# Patient Record
Sex: Female | Born: 1940 | Race: White | Hispanic: No | Smoking: Never smoker
Health system: Southern US, Community
[De-identification: ages and names within clinical notes are randomized; demographics above are authoritative.]

## PROBLEM LIST (undated history)

## (undated) HISTORY — PX: TONSILLECTOMY: SUR1361

## (undated) HISTORY — PX: OOPHORECTOMY: SHX86

## (undated) HISTORY — PX: APPENDECTOMY: SHX54

## (undated) HISTORY — PX: STAPEDES SURGERY: SHX789

---

## 1999-03-07 ENCOUNTER — Other Ambulatory Visit: Admission: RE | Admit: 1999-03-07 | Discharge: 1999-03-07 | Payer: Self-pay | Admitting: Obstetrics & Gynecology

## 1999-06-29 ENCOUNTER — Ambulatory Visit (HOSPITAL_COMMUNITY): Admission: RE | Admit: 1999-06-29 | Discharge: 1999-06-29 | Payer: Self-pay | Admitting: Gastroenterology

## 2000-03-21 ENCOUNTER — Other Ambulatory Visit: Admission: RE | Admit: 2000-03-21 | Discharge: 2000-03-21 | Payer: Self-pay | Admitting: Obstetrics & Gynecology

## 2001-05-08 ENCOUNTER — Other Ambulatory Visit: Admission: RE | Admit: 2001-05-08 | Discharge: 2001-05-08 | Payer: Self-pay | Admitting: Obstetrics & Gynecology

## 2002-05-14 ENCOUNTER — Other Ambulatory Visit: Admission: RE | Admit: 2002-05-14 | Discharge: 2002-05-14 | Payer: Self-pay | Admitting: Obstetrics & Gynecology

## 2003-06-27 ENCOUNTER — Other Ambulatory Visit: Admission: RE | Admit: 2003-06-27 | Discharge: 2003-06-27 | Payer: Self-pay | Admitting: Obstetrics & Gynecology

## 2004-07-06 ENCOUNTER — Other Ambulatory Visit: Admission: RE | Admit: 2004-07-06 | Discharge: 2004-07-06 | Payer: Self-pay | Admitting: Obstetrics & Gynecology

## 2005-08-28 ENCOUNTER — Other Ambulatory Visit: Admission: RE | Admit: 2005-08-28 | Discharge: 2005-08-28 | Payer: Self-pay | Admitting: Obstetrics & Gynecology

## 2012-03-10 ENCOUNTER — Encounter: Payer: Self-pay | Admitting: Gastroenterology

## 2012-04-03 ENCOUNTER — Ambulatory Visit (AMBULATORY_SURGERY_CENTER): Payer: Medicare Other | Admitting: *Deleted

## 2012-04-03 VITALS — Ht 60.0 in | Wt 134.0 lb

## 2012-04-03 DIAGNOSIS — Z1211 Encounter for screening for malignant neoplasm of colon: Secondary | ICD-10-CM

## 2012-04-03 MED ORDER — MOVIPREP 100 G PO SOLR: ORAL | Status: DC

## 2012-04-17 ENCOUNTER — Telehealth: Payer: Self-pay | Admitting: Gastroenterology

## 2012-04-17 ENCOUNTER — Encounter: Payer: Self-pay | Admitting: Gastroenterology

## 2012-04-17 ENCOUNTER — Ambulatory Visit (AMBULATORY_SURGERY_CENTER): Payer: Medicare Other | Admitting: Gastroenterology

## 2012-04-17 VITALS — BP 150/84 | HR 67 | Temp 98.2°F | Resp 18 | Ht 60.0 in | Wt 134.0 lb

## 2012-04-17 DIAGNOSIS — K5730 Diverticulosis of large intestine without perforation or abscess without bleeding: Secondary | ICD-10-CM

## 2012-04-17 DIAGNOSIS — Z1211 Encounter for screening for malignant neoplasm of colon: Secondary | ICD-10-CM

## 2012-04-17 MED ORDER — SODIUM CHLORIDE 0.9 % IV SOLN: 500.0000 mL | INTRAVENOUS | Status: DC

## 2012-04-17 NOTE — Op Note (Signed)
[facility] [address] [place], [place]  [address]  COLONOSCOPY PROCEDURE REPORT  PATIENT:  Karina Adams, Karina Adams  MR#:  [id number] BIRTHDATE:  1941-07-19, 71 yrs. old  GENDER:  female ENDOSCOPIST:  Barbette Hair. Arlyce Dice, MD REF. BY:  Mila Palmer, M.D. PROCEDURE DATE:  04/17/2012 PROCEDURE:  Diagnostic Colonoscopy ASA CLASS:  Class II INDICATIONS:  Routine Risk Screening MEDICATIONS:   MAC sedation, administered by CRNA propofol 250mg IV  DESCRIPTION OF PROCEDURE:   After the risks benefits and alternatives of the procedure were thoroughly explained, informed consent was obtained.  Digital rectal exam was performed and revealed external hemorrhoids.   The LB [id number] endoscope was introduced through the anus and advanced to the cecum, which was identified by both the appendix and ileocecal valve, without limitations.  The quality of the prep was excellent, using MoviPrep.  The instrument was then slowly withdrawn as the colon was fully examined. <<PROCEDUREIMAGES>>  FINDINGS:  Scattered diverticula were found (see image1). sigmoid to transverse colon  Internal Hemorrhoids were found (see image3). This was otherwise a normal examination of the colon (see image2). Retroflexed views in the rectum revealed no abnormalities.    The time to cecum =  1) 9.75  minutes. The scope was then withdrawn in 1) 6.0  minutes from the cecum and the procedure completed. COMPLICATIONS:  None ENDOSCOPIC IMPRESSION: 1) Diverticula, scattered 2) Internal hemorrhoids 3) Otherwise normal examination RECOMMENDATIONS: 1) Continue current colorectal screening recommendations for "routine risk" patients with a repeat colonoscopy in 10 years. REPEAT EXAM:  In 10 year(s) for Colonoscopy.  ______________________________ Barbette Hair. Arlyce Dice, MD  CC:  n. eSIGNED:   Barbette Hair. Zebastian Carico at 04/17/2012 08:43 AM  Jeannett Senior, [id number]

## 2012-04-17 NOTE — Patient Instructions (Addendum)
YOU HAD AN ENDOSCOPIC PROCEDURE TODAY AT [facility]: Refer to the procedure report that was given to you for any specific questions about what was found during the examination.  If the procedure report does not answer your questions, please call your gastroenterologist to clarify.  If you requested that your care partner not be given the details of your procedure findings, then the procedure report has been included in a sealed envelope for you to review at your convenience later.  YOU SHOULD EXPECT: Some feelings of bloating in the abdomen. Passage of more gas than usual.  Walking can help get rid of the air that was put into your GI tract during the procedure and reduce the bloating. If you had a lower endoscopy (such as a colonoscopy or flexible sigmoidoscopy) you may notice spotting of blood in your stool or on the toilet paper. If you underwent a bowel prep for your procedure, then you may not have a normal bowel movement for a few days.  DIET: Your first meal following the procedure should be a light meal and then it is ok to progress to your normal diet.  A half-sandwich or bowl of soup is an example of a good first meal.  Heavy or fried foods are harder to digest and may make you feel nauseous or bloated.  Likewise meals heavy in dairy and vegetables can cause extra gas to form and this can also increase the bloating.  Drink plenty of fluids but you should avoid alcoholic beverages for 24 hours.  ACTIVITY: Your care partner should take you home directly after the procedure.  You should plan to take it easy, moving slowly for the rest of the day.  You can resume normal activity the day after the procedure however you should NOT DRIVE or use heavy machinery for 24 hours (because of the sedation medicines used during the test).    SYMPTOMS TO REPORT IMMEDIATELY: A gastroenterologist can be reached at any hour.  During normal business hours, 8:30 AM to 5:00 PM Monday through Friday,  call [phone].  After hours and on weekends, please call the GI answering service at [phone] who will take a message and have the physician on call contact you.   Following lower endoscopy (colonoscopy or flexible sigmoidoscopy):  Excessive amounts of blood in the stool  Significant tenderness or worsening of abdominal pains  Swelling of the abdomen that is new, acute  Fever of 100F or higher     FOLLOW UP: If any biopsies were taken you will be contacted by phone or by letter within the next 1-3 weeks.  Call your gastroenterologist if you have not heard about the biopsies in 3 weeks.  Our staff will call the home number listed on your records the next business day following your procedure to check on you and address any questions or concerns that you may have at that time regarding the information given to you following your procedure. This is a courtesy call and so if there is no answer at the home number and we have not heard from you through the emergency physician on call, we will assume that you have returned to your regular daily activities without incident.  SIGNATURES/CONFIDENTIALITY: You and/or your care partner have signed paperwork which will be entered into your electronic medical record.  These signatures attest to the fact that that the information above on your After Visit Summary has been reviewed and is understood.  Full responsibility of the  confidentiality of this discharge information lies with you and/or your care-partner.   Diverticulosis, hemorrhoid information, high fiber diet information given.  Folow-up in 10 years-2023

## 2012-04-17 NOTE — Telephone Encounter (Signed)
On call note @ 2205. Pt had a diagnostic colonoscopy today and notes a sore throat and congestion since the procedure and now has temp of 100.5. Pt has no abd pain, N/V, chest pain, back pain. This does not seem to be a procedure related complication. Advised to take Tylenol and push fluids tonight. Advised to contact her PCP if symptoms persist.

## 2012-04-17 NOTE — Progress Notes (Signed)
Patient did not experience any of the following events: a burn prior to discharge; a fall within the facility; wrong site/side/patient/procedure/implant event; or a hospital transfer or hospital admission upon discharge from the facility. [phone]) Patient did not have preoperative order for IV antibiotic SSI prophylaxis. [phone])   Pt. Complained with dry throat.  Taking liquids without difficulty.   Dr. Arlyce Dice aware.

## 2012-04-20 ENCOUNTER — Telehealth: Payer: Self-pay | Admitting: *Deleted

## 2012-04-20 NOTE — Telephone Encounter (Signed)
Called patient to inform of Dr. Marzetta Board thoughts that she has a URI. Informed patient to seek attention from PCP if condition worsens or becomes febrile or sputum changes color.Patient appreciative for the call and in agreement.

## 2012-04-20 NOTE — Telephone Encounter (Signed)
  Follow up Call-  Call back number 04/17/2012  Post procedure Call Back phone  # [phone]  Permission to leave phone message Yes     Patient questions:  Do you have a fever, pain , or abdominal swelling? yes Pain Score  1 *  Have you tolerated food without any problems? yes  Have you been able to return to your normal activities? no  Do you have any questions about your discharge instructions: Diet   no Medications  no Follow up visit  no  Do you have questions or concerns about your Care? yes  Actions: * If pain score is 4 or above: No action needed, pain <4.  PATIENT STATING SHE" DID NOT HAVE A GOOD EXPERIENCE THIS TIME." PATIENT STATING SHE HAD A SEVERE SORE THROAT THIS WEEKEND, BETTER TODAY. STATING SHE DEVELOPED A FEVER OF 100 ON Friday EVENING AND CALLED THE ON CALL NUMBER. NO FEVER SINCE Friday. PATIENT STATING SHE IS SWALLOWING FINE AND ABLE TO TOLERATE FOODS. PATIENT STATING SHE STILL HAS A COUGH, NO SPUTUM. INFORMED PATIENT TO CALL us IF HER CONDITION WORSENS. NOTE ROUTED TO DR. KAPLAN FOR ANY FURTHER ORDERS.

## 2012-04-20 NOTE — Telephone Encounter (Signed)
Very doubtful that her symptoms are related to colonoscopy. It sounds like she has an upper respiratory infection. No further suggestions.

## 2015-05-31 DIAGNOSIS — H04123 Dry eye syndrome of bilateral lacrimal glands: Secondary | ICD-10-CM | POA: Diagnosis not present

## 2015-05-31 DIAGNOSIS — H40053 Ocular hypertension, bilateral: Secondary | ICD-10-CM | POA: Diagnosis not present

## 2015-07-24 DIAGNOSIS — Z1231 Encounter for screening mammogram for malignant neoplasm of breast: Secondary | ICD-10-CM | POA: Diagnosis not present

## 2015-08-01 DIAGNOSIS — Z23 Encounter for immunization: Secondary | ICD-10-CM | POA: Diagnosis not present

## 2015-12-11 DIAGNOSIS — I10 Essential (primary) hypertension: Secondary | ICD-10-CM | POA: Insufficient documentation

## 2015-12-12 ENCOUNTER — Ambulatory Visit (INDEPENDENT_AMBULATORY_CARE_PROVIDER_SITE_OTHER): Payer: Medicare Other

## 2015-12-12 ENCOUNTER — Encounter: Payer: Self-pay | Admitting: *Deleted

## 2015-12-12 DIAGNOSIS — I10 Essential (primary) hypertension: Secondary | ICD-10-CM | POA: Diagnosis not present

## 2015-12-12 NOTE — Progress Notes (Signed)
Patient ID: Karina Adams, female   DOB: 08/03/1941, 75 y.o.   MRN: [id number] 24 Hour ambulatory blood pressure monitor applied to patient.

## 2020-02-29 ENCOUNTER — Other Ambulatory Visit: Payer: Self-pay | Admitting: Family Medicine

## 2020-02-29 DIAGNOSIS — M810 Age-related osteoporosis without current pathological fracture: Secondary | ICD-10-CM

## 2020-06-21 DIAGNOSIS — H40013 Open angle with borderline findings, low risk, bilateral: Secondary | ICD-10-CM | POA: Diagnosis not present

## 2020-06-26 DIAGNOSIS — L570 Actinic keratosis: Secondary | ICD-10-CM | POA: Diagnosis not present

## 2020-06-26 DIAGNOSIS — L821 Other seborrheic keratosis: Secondary | ICD-10-CM | POA: Diagnosis not present

## 2020-06-26 DIAGNOSIS — L578 Other skin changes due to chronic exposure to nonionizing radiation: Secondary | ICD-10-CM | POA: Diagnosis not present

## 2020-06-26 DIAGNOSIS — D225 Melanocytic nevi of trunk: Secondary | ICD-10-CM | POA: Diagnosis not present

## 2020-06-26 DIAGNOSIS — L814 Other melanin hyperpigmentation: Secondary | ICD-10-CM | POA: Diagnosis not present

## 2020-06-29 DIAGNOSIS — Z23 Encounter for immunization: Secondary | ICD-10-CM | POA: Diagnosis not present

## 2020-07-20 ENCOUNTER — Other Ambulatory Visit: Payer: Self-pay

## 2020-07-20 ENCOUNTER — Ambulatory Visit
Admission: RE | Admit: 2020-07-20 | Discharge: 2020-07-20 | Disposition: A | Discharge status: Home / Self Care | Payer: Self-pay | Source: Ambulatory Visit | Attending: Family Medicine | Admitting: Family Medicine

## 2020-07-20 DIAGNOSIS — Z780 Asymptomatic menopausal state: Secondary | ICD-10-CM | POA: Diagnosis not present

## 2020-07-20 DIAGNOSIS — M810 Age-related osteoporosis without current pathological fracture: Secondary | ICD-10-CM

## 2020-07-20 DIAGNOSIS — M8589 Other specified disorders of bone density and structure, multiple sites: Secondary | ICD-10-CM | POA: Diagnosis not present

## 2020-08-24 DIAGNOSIS — Z1231 Encounter for screening mammogram for malignant neoplasm of breast: Secondary | ICD-10-CM | POA: Diagnosis not present

## 2020-10-31 DIAGNOSIS — H40013 Open angle with borderline findings, low risk, bilateral: Secondary | ICD-10-CM | POA: Diagnosis not present

## 2020-10-31 DIAGNOSIS — H26493 Other secondary cataract, bilateral: Secondary | ICD-10-CM | POA: Diagnosis not present

## 2020-10-31 DIAGNOSIS — H04123 Dry eye syndrome of bilateral lacrimal glands: Secondary | ICD-10-CM | POA: Diagnosis not present

## 2021-03-26 DIAGNOSIS — M810 Age-related osteoporosis without current pathological fracture: Secondary | ICD-10-CM | POA: Diagnosis not present

## 2021-03-26 DIAGNOSIS — I10 Essential (primary) hypertension: Secondary | ICD-10-CM | POA: Diagnosis not present

## 2021-03-26 DIAGNOSIS — D692 Other nonthrombocytopenic purpura: Secondary | ICD-10-CM | POA: Diagnosis not present

## 2021-03-26 DIAGNOSIS — Z0000 Encounter for general adult medical examination without abnormal findings: Secondary | ICD-10-CM | POA: Diagnosis not present

## 2021-03-26 DIAGNOSIS — E559 Vitamin D deficiency, unspecified: Secondary | ICD-10-CM | POA: Diagnosis not present

## 2021-03-26 DIAGNOSIS — Z79899 Other long term (current) drug therapy: Secondary | ICD-10-CM | POA: Diagnosis not present

## 2021-06-27 DIAGNOSIS — Z23 Encounter for immunization: Secondary | ICD-10-CM | POA: Diagnosis not present

## 2021-07-18 DIAGNOSIS — H40013 Open angle with borderline findings, low risk, bilateral: Secondary | ICD-10-CM | POA: Diagnosis not present

## 2021-07-18 DIAGNOSIS — H04123 Dry eye syndrome of bilateral lacrimal glands: Secondary | ICD-10-CM | POA: Diagnosis not present

## 2021-09-03 DIAGNOSIS — Z1231 Encounter for screening mammogram for malignant neoplasm of breast: Secondary | ICD-10-CM | POA: Diagnosis not present

## 2022-03-22 DIAGNOSIS — H40013 Open angle with borderline findings, low risk, bilateral: Secondary | ICD-10-CM | POA: Diagnosis not present

## 2022-03-22 DIAGNOSIS — H26493 Other secondary cataract, bilateral: Secondary | ICD-10-CM | POA: Diagnosis not present

## 2022-03-26 DIAGNOSIS — I10 Essential (primary) hypertension: Secondary | ICD-10-CM | POA: Diagnosis not present

## 2022-03-26 DIAGNOSIS — Z79899 Other long term (current) drug therapy: Secondary | ICD-10-CM | POA: Diagnosis not present

## 2022-03-26 DIAGNOSIS — E559 Vitamin D deficiency, unspecified: Secondary | ICD-10-CM | POA: Diagnosis not present

## 2022-03-28 DIAGNOSIS — M810 Age-related osteoporosis without current pathological fracture: Secondary | ICD-10-CM | POA: Diagnosis not present

## 2022-03-28 DIAGNOSIS — I10 Essential (primary) hypertension: Secondary | ICD-10-CM | POA: Diagnosis not present

## 2022-03-28 DIAGNOSIS — E559 Vitamin D deficiency, unspecified: Secondary | ICD-10-CM | POA: Diagnosis not present

## 2022-03-28 DIAGNOSIS — Z79899 Other long term (current) drug therapy: Secondary | ICD-10-CM | POA: Diagnosis not present

## 2022-03-28 DIAGNOSIS — Z0000 Encounter for general adult medical examination without abnormal findings: Secondary | ICD-10-CM | POA: Diagnosis not present

## 2022-04-02 DIAGNOSIS — D225 Melanocytic nevi of trunk: Secondary | ICD-10-CM | POA: Diagnosis not present

## 2022-04-02 DIAGNOSIS — L820 Inflamed seborrheic keratosis: Secondary | ICD-10-CM | POA: Diagnosis not present

## 2022-04-02 DIAGNOSIS — L570 Actinic keratosis: Secondary | ICD-10-CM | POA: Diagnosis not present

## 2022-04-02 DIAGNOSIS — L821 Other seborrheic keratosis: Secondary | ICD-10-CM | POA: Diagnosis not present

## 2022-04-02 DIAGNOSIS — L814 Other melanin hyperpigmentation: Secondary | ICD-10-CM | POA: Diagnosis not present

## 2022-04-02 DIAGNOSIS — L578 Other skin changes due to chronic exposure to nonionizing radiation: Secondary | ICD-10-CM | POA: Diagnosis not present

## 2022-06-14 ENCOUNTER — Encounter: Payer: Self-pay | Admitting: Internal Medicine

## 2022-07-30 DIAGNOSIS — Z23 Encounter for immunization: Secondary | ICD-10-CM | POA: Diagnosis not present

## 2022-09-16 DIAGNOSIS — Z1231 Encounter for screening mammogram for malignant neoplasm of breast: Secondary | ICD-10-CM | POA: Diagnosis not present

## 2022-09-18 ENCOUNTER — Encounter: Payer: Self-pay | Admitting: Internal Medicine

## 2022-09-18 ENCOUNTER — Ambulatory Visit: Payer: Medicare PPO | Admitting: Internal Medicine

## 2022-09-18 VITALS — BP 130/78 | HR 76 | Ht 60.0 in | Wt 154.0 lb

## 2022-09-18 DIAGNOSIS — Z1211 Encounter for screening for malignant neoplasm of colon: Secondary | ICD-10-CM | POA: Diagnosis not present

## 2022-09-18 DIAGNOSIS — K5730 Diverticulosis of large intestine without perforation or abscess without bleeding: Secondary | ICD-10-CM | POA: Diagnosis not present

## 2022-09-18 NOTE — Patient Instructions (Addendum)
_______________________________________________________  If you are age 82 or older, your body mass index should be between 23-30. Your Body mass index is 30.08 kg/m. If this is out of the aforementioned range listed, please consider follow up with your Primary Care Provider.  If you are age 61 or younger, your body mass index should be between 19-25. Your Body mass index is 30.08 kg/m. If this is out of the aformentioned range listed, please consider follow up with your Primary Care Provider.   ________________________________________________________  The [facility] providers would like to encourage you to use [facility] to communicate with providers for non-urgent requests or questions.  Due to long hold times on the telephone, sending your provider a message by [facility] may be a faster and more efficient way to get a response.  Please allow 48 business hours for a response.  Please remember that this is for non-urgent requests.  _______________________________________________________  As discussed at today's office visit we will discontinue colonoscopies for colon cancer screening per your request.  You will follow up in our office on an as needed basis.  Thank you for entrusting me with your care and choosing [facility].  Dr Leonides Schanz

## 2022-09-18 NOTE — Progress Notes (Signed)
Chief Complaint: Colon cancer screening  HPI : 81 year old female with history of diverticulosis presents to discuss colon cancer screening.  Her last colonoscopy was 10 years ago that had no polyps. Patient states that the colonoscopy prior to that also had no polyps. She denies any blood in the stools, changes in the bowel habits, unintentional weight loss. Denies family history of colon cancer. Denies diarrhea or constipation. Denies N&V, dysphagia, or acid reflux. She follows with her PCP that keeps up with her routine labs, which have been normal. She lives by herself and is able to keep up with her activities of daily living.  History reviewed. No pertinent past medical history.  Past Surgical History:  Procedure Laterality Date   APPENDECTOMY  age 76   CESAREAN SECTION     OOPHORECTOMY  age 88   & cyst removed   STAPEDES SURGERY  age 39   stapendectomy   TONSILLECTOMY  age 1    Family History  Problem Relation Age of Onset   High blood pressure Mother    Breast cancer Mother    Atrial fibrillation Brother    Colon cancer Neg Hx    Liver disease Neg Hx    Esophageal cancer Neg Hx    Social History   Tobacco Use   Smoking status: Never   Smokeless tobacco: Never  Substance Use Topics   Alcohol use: Yes    Alcohol/week: 5.0 standard drinks of alcohol    Types: 5 Glasses of wine per week   Drug use: No   Current Outpatient Medications  Medication Sig Dispense Refill   alendronate (FOSAMAX) 70 MG tablet 1 tablet 30 minutes before the first food, beverage or medicine of the day with plain water Orally for 30 day(s)     aspirin 81 MG tablet Take 81 mg by mouth daily.     Biotin 10 MG CAPS Take 1 capsule by mouth daily.     Calcium Carbonate-Vitamin D (CALTRATE 600+D PO) Take 1 tablet by mouth daily.     Cholecalciferol (VITAMIN D3) 1000 UNITS CAPS Take 2 capsules by mouth daily.     lisinopril (ZESTRIL) 10 MG tablet 1 tablet Orally Once a day for 30 day(s)      Multiple Vitamins-Minerals (CENTRUM SILVER PO) Take 1 tablet by mouth daily.     No current facility-administered medications for this visit.   No Known Allergies   Review of Systems: All systems reviewed and negative except where noted in HPI.   Physical Exam: BP 130/78   Pulse 76   Ht 5' (1.524 m)   Wt 154 lb (69.9 kg)   SpO2 97%   BMI 30.08 kg/m  Constitutional: Pleasant,well-developed, female in no acute distress. HEENT: Normocephalic and atraumatic. Conjunctivae are normal. No scleral icterus. Cardiovascular: Normal rate, regular rhythm.  Pulmonary/chest: Effort normal and breath sounds normal. No wheezing, rales or rhonchi. Abdominal: Soft, nondistended, nontender. Bowel sounds active throughout. There are no masses palpable. No hepatomegaly. Extremities: No edema Neurological: Alert and oriented to person place and time. Skin: Skin is warm and dry. No rashes noted. Psychiatric: Normal mood and affect. Behavior is normal.  Colonoscopy 04/17/12:   ASSESSMENT AND PLAN: Colon cancer screening Diverticulosis Patient is here to discuss whether or not to get another colonoscopy for colon cancer screening. She has never had colon polyps in the past, but she is otherwise healthy. Per patient preference due to some residual side effects from the sedation during her last colonoscopy,  she will plan to discontinue future colonoscopies for colon cancer screening. - Patient would like to discontinue colonoscopies for colon cancer screening  Eulah Pont, MD

## 2022-09-24 DIAGNOSIS — R922 Inconclusive mammogram: Secondary | ICD-10-CM | POA: Diagnosis not present

## 2022-11-05 ENCOUNTER — Other Ambulatory Visit: Payer: Self-pay | Admitting: Family Medicine

## 2022-11-05 DIAGNOSIS — M810 Age-related osteoporosis without current pathological fracture: Secondary | ICD-10-CM

## 2022-12-18 DIAGNOSIS — H40013 Open angle with borderline findings, low risk, bilateral: Secondary | ICD-10-CM | POA: Diagnosis not present

## 2023-03-19 DIAGNOSIS — Z822 Family history of deafness and hearing loss: Secondary | ICD-10-CM | POA: Diagnosis not present

## 2023-03-19 DIAGNOSIS — H906 Mixed conductive and sensorineural hearing loss, bilateral: Secondary | ICD-10-CM | POA: Diagnosis not present

## 2023-04-15 ENCOUNTER — Ambulatory Visit
Admission: RE | Admit: 2023-04-15 | Discharge: 2023-04-15 | Disposition: A | Discharge status: Home / Self Care | Payer: Medicare PPO | Source: Ambulatory Visit | Attending: Family Medicine | Admitting: Family Medicine

## 2023-04-15 DIAGNOSIS — M810 Age-related osteoporosis without current pathological fracture: Secondary | ICD-10-CM

## 2023-04-15 DIAGNOSIS — N958 Other specified menopausal and perimenopausal disorders: Secondary | ICD-10-CM | POA: Diagnosis not present

## 2023-04-15 DIAGNOSIS — M8588 Other specified disorders of bone density and structure, other site: Secondary | ICD-10-CM | POA: Diagnosis not present

## 2023-04-15 DIAGNOSIS — E349 Endocrine disorder, unspecified: Secondary | ICD-10-CM | POA: Diagnosis not present

## 2023-04-22 DIAGNOSIS — I10 Essential (primary) hypertension: Secondary | ICD-10-CM | POA: Diagnosis not present

## 2023-04-22 DIAGNOSIS — Z0000 Encounter for general adult medical examination without abnormal findings: Secondary | ICD-10-CM | POA: Diagnosis not present

## 2023-04-22 DIAGNOSIS — Z79899 Other long term (current) drug therapy: Secondary | ICD-10-CM | POA: Diagnosis not present

## 2023-04-22 DIAGNOSIS — M8588 Other specified disorders of bone density and structure, other site: Secondary | ICD-10-CM | POA: Diagnosis not present

## 2023-04-22 DIAGNOSIS — E559 Vitamin D deficiency, unspecified: Secondary | ICD-10-CM | POA: Diagnosis not present

## 2023-05-12 DIAGNOSIS — R7301 Impaired fasting glucose: Secondary | ICD-10-CM | POA: Diagnosis not present

## 2023-07-18 DIAGNOSIS — H26493 Other secondary cataract, bilateral: Secondary | ICD-10-CM | POA: Diagnosis not present

## 2023-07-18 DIAGNOSIS — H04123 Dry eye syndrome of bilateral lacrimal glands: Secondary | ICD-10-CM | POA: Diagnosis not present

## 2023-07-18 DIAGNOSIS — H40013 Open angle with borderline findings, low risk, bilateral: Secondary | ICD-10-CM | POA: Diagnosis not present

## 2023-09-22 DIAGNOSIS — Z1231 Encounter for screening mammogram for malignant neoplasm of breast: Secondary | ICD-10-CM | POA: Diagnosis not present

## 2023-11-03 DIAGNOSIS — I7090 Unspecified atherosclerosis: Secondary | ICD-10-CM | POA: Diagnosis not present

## 2023-11-03 DIAGNOSIS — I10 Essential (primary) hypertension: Secondary | ICD-10-CM | POA: Diagnosis not present

## 2023-11-03 DIAGNOSIS — R7303 Prediabetes: Secondary | ICD-10-CM | POA: Diagnosis not present

## 2023-11-03 DIAGNOSIS — Z23 Encounter for immunization: Secondary | ICD-10-CM | POA: Diagnosis not present

## 2024-07-15 ENCOUNTER — Other Ambulatory Visit (HOSPITAL_BASED_OUTPATIENT_CLINIC_OR_DEPARTMENT_OTHER): Payer: Self-pay | Admitting: Family Medicine

## 2024-07-15 DIAGNOSIS — Z780 Asymptomatic menopausal state: Secondary | ICD-10-CM

## 2025-01-04 ENCOUNTER — Other Ambulatory Visit (HOSPITAL_BASED_OUTPATIENT_CLINIC_OR_DEPARTMENT_OTHER)
